# Patient Record
Sex: Female | Born: 1990 | Race: White | Hispanic: No | Marital: Single | Smoking: Never smoker
Health system: Southern US, Community
[De-identification: ages and names within clinical notes are randomized; demographics above are authoritative.]

## PROBLEM LIST (undated history)

## (undated) DIAGNOSIS — E0590 Thyrotoxicosis, unspecified without thyrotoxic crisis or storm: Secondary | ICD-10-CM

## (undated) DIAGNOSIS — E0500 Thyrotoxicosis with diffuse goiter without thyrotoxic crisis or storm: Secondary | ICD-10-CM

## (undated) DIAGNOSIS — R87612 Low grade squamous intraepithelial lesion on cytologic smear of cervix (LGSIL): Secondary | ICD-10-CM

## (undated) DIAGNOSIS — M779 Enthesopathy, unspecified: Secondary | ICD-10-CM

## (undated) DIAGNOSIS — L709 Acne, unspecified: Secondary | ICD-10-CM

## (undated) HISTORY — DX: Thyrotoxicosis, unspecified without thyrotoxic crisis or storm: E05.90

## (undated) HISTORY — DX: Acne, unspecified: L70.9

## (undated) HISTORY — DX: Low grade squamous intraepithelial lesion on cytologic smear of cervix (LGSIL): R87.612

## (undated) HISTORY — DX: Thyrotoxicosis with diffuse goiter without thyrotoxic crisis or storm: E05.00

## (undated) HISTORY — PX: TYMPANOSTOMY TUBE PLACEMENT: SHX32

## (undated) HISTORY — DX: Enthesopathy, unspecified: M77.9

---
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
## 2012-12-31 DIAGNOSIS — R87612 Low grade squamous intraepithelial lesion on cytologic smear of cervix (LGSIL): Secondary | ICD-10-CM

## 2012-12-31 HISTORY — DX: Low grade squamous intraepithelial lesion on cytologic smear of cervix (LGSIL): R87.612

## 2013-01-31 HISTORY — PX: COLPOSCOPY: SHX161

## 2013-03-02 HISTORY — PX: TONSILECTOMY/ADENOIDECTOMY WITH MYRINGOTOMY: SHX6125

## 2013-03-02 HISTORY — PX: THYROIDECTOMY: SHX17

## 2013-03-29 ENCOUNTER — Ambulatory Visit: Payer: Self-pay

## 2013-04-04 HISTORY — PX: OTHER SURGICAL HISTORY: SHX169

## 2013-05-24 ENCOUNTER — Emergency Department: Payer: Self-pay | Admitting: Emergency Medicine

## 2013-05-24 LAB — URINALYSIS, COMPLETE
Bilirubin,UR: NEGATIVE
Glucose,UR: NEGATIVE mg/dL (ref 0–75)
Ketone: NEGATIVE
Nitrite: NEGATIVE
Ph: 7 (ref 4.5–8.0)
Protein: 30
RBC,UR: 156 /HPF (ref 0–5)
Specific Gravity: 1.013 (ref 1.003–1.030)
Squamous Epithelial: 4
WBC UR: 355 /HPF (ref 0–5)

## 2013-12-12 DIAGNOSIS — N879 Dysplasia of cervix uteri, unspecified: Secondary | ICD-10-CM | POA: Insufficient documentation

## 2014-02-01 ENCOUNTER — Ambulatory Visit: Payer: Self-pay | Admitting: Otolaryngology

## 2014-02-01 LAB — ALBUMIN: Albumin: 3.4 g/dL (ref 3.4–5.0)

## 2014-02-01 LAB — CALCIUM
Calcium, Total: 8.5 mg/dL (ref 8.5–10.1)
Calcium, Total: 8.4 mg/dL — ABNORMAL LOW (ref 8.5–10.1)

## 2014-02-02 LAB — CALCIUM: Calcium, Total: 8.7 mg/dL (ref 8.5–10.1)

## 2014-06-18 DIAGNOSIS — E890 Postprocedural hypothyroidism: Secondary | ICD-10-CM | POA: Insufficient documentation

## 2014-06-23 NOTE — Op Note (Signed)
PATIENT NAME:  Samantha Gallagher, Samantha Gallagher MR#:  [id number] DATE OF BIRTH:  08/10/1990  DATE OF PROCEDURE:  02/01/2014  PREOPERATIVE DIAGNOSES:  1. Graves' disease with hyperthyroidism.  2. Thyroid nodule.  3. Chronic tonsillitis.  4. Tonsillar hypertrophy.  5. Sleep disordered breathing.   POSTOPERATIVE DIAGNOSES: 1. Graves' disease with hyperthyroidism.  2. Thyroid nodule.  3. Chronic tonsillitis.  4. Tonsillar hypertrophy.  5. Sleep disordered breathing.    PROCEDURE PERFORMED:  1.  Minimally invasive total thyroidectomy with laryngeal nerve monitoring.  2.  Tonsillectomy, greater than age 9.   SURGEON: Kyung Rudd, M.D.   ASSISTANT: Dr. Marion Downer   ANESTHESIA: General endotracheal anesthesia.   ESTIMATED BLOOD LOSS: 25 mL.   IV FLUIDS: Please see anesthesia record.   COMPLICATIONS: None.   DRAINS AND STENT PLACEMENTS: Surgicel in the thyroid bed.   SPECIMENS:  1. Total thyroidectomy marked at the right superior pole.  2. Right and left tonsils sent for permanent pathological evaluation.   INDICATIONS FOR PROCEDURE: The patient is a 24 year old female with a history of Graves disease on methimazole, followed by Dr. Wendall Mola  for endocrinology. The decision was made to proceed with total thyroidectomy after discussion of treatment options. The patient also began to develop thyroid nodules that were new. The patient also has a longstanding history of tonsillar hypertrophy, sleep-disordered breathing and chronic tonsillitis as well. The decision was made to proceed with a tonsillectomy at the same time.   OPERATIVE FINDINGS: Total thyroidectomy successfully performed. Bilateral nodules. Bilateral superior and inferior parathyroid glands were identified and preserved. Bilateral recurrent laryngeal nerves were identified and preserved and stimulated throughout the duration of the case. 4+ cryptic tonsils with purulence.   DESCRIPTION OF PROCEDURE: After the patient was  identified in holding, the benefits and risks of the procedure were discussed and consent was reviewed. The patient was taken to the Operating Room and placed in the supine position. General endotracheal anesthesia with laryngeal nerve monitoring was placed, and this was confirmed with the glide laryngoscope. At this time, a previously marked anterior neck crease was injected with 5 mL of 0.25% Marcaine 1:100,000 epinephrine as well as prepped and draped in sterile fashion. A 15 blade scalpel was used to make a horizontal neck incision, and dissection through subcutaneous tissues was performed using Bovie electrocautery. Strap muscles were encountered. These were divided vertically along the median raphe from the sternal notch to the thyroid notch. This demonstrated a bulging thyroid, coming through the sternohyoid and sternothyroid muscles beneath that. Attention was directed to the patient's left hemithyroid. The sternohyoid and sternothyroid muscles were separated from the left hemithyroid. There was a moderate amount of scarring to the musculature and to the thyroid capsule. The lateral border was identified. This was dissected bluntly and inferiorly and superiorly, and then a cleft between the larynx and the superior thyroid lobe on the left side was created in Joels space, and then the superior pole was pedicled, and this was ligated using a Harmonic scalpel. At this time, the left hemithyroid was retracted medially with a Terris retractor, and the recurrent laryngeal nerve was identified in the tracheoesophageal groove. This was robustly stimulated. There was a superior parathyroid gland as well as an inferior parathyroid gland which was gently dissected away from the capsule. At this time, the nerve was traced until its insertion into the patient's larynx. The remaining attachments of Berry's ligament were divided with Harmonic scalpel and bipolar, and then the remaining attachments of the left  hemithyroid  were dissected away from the patient's trachea and larynx. Attention at this time was directed to the patient's right thyroid. Again, the sternohyoid and sternothyroid muscles were bluntly dissected, and the lateral edge of the right thyroid was dissected superiorly and inferiorly. The right Joels space between the larynx and the superior thyroid pole was bluntly dissected, and the superior pole was ligated once it was pedicled with a Harmonic scalpel. The right hemithyroid was then retracted medially, and the right hemithyroid was delivered from the wound, given its size. This demonstrated a pedicle-ized recurrent laryngeal nerve as it entered into Berry ligament. This was gently and bluntly dissected and robustly stimulated until the  attachment of Berry ligament were separated. The right inferior parathyroid gland was identified and preserved. The right superior parathyroid gland was identified and preserved. Then, inferior thyroid artery was ligated with the Harmonic scalpel. At this time, once both bilateral recurrent laryngeal nerves as well as superior and inferior parathyroid glands had been identified and protected, the remaining attachments of the thyroid, including a pyramidal lobe, were bluntly and sharply dissected away from the patient's anterior trachea, and this was marked and right superior thyroid lobe for permanent pathological evaluation. At this time, the thyroid beds were copiously irrigated with sterile saline. Meticulous hemostasis was ensured. Surgicel was placed along the thyroid bed, bilaterally. The strap muscles were loosely reapproximated with a single Vicryl stitch, and then the subcutaneous tissues were closed using interrupted Vicryl and then the skin was closed using skin adhesive and topped with a Steri-Strip.    At this time, attention was directed to the patient's tonsillectomy. The patient was rotated to 45 degrees. A shoulder roll was not used, and a doughnut was placed  behind the patient's head, and a McIvor mouth gag was inserted in the patient's oral cavity and suspended from a Mayo stand. A red rubber catheter was placed in the patient's right nasal cavity for retraction of the uvula and soft palate superiorly. At this time, a curved Allis clamp was attached to the superior pole of the patient's right tonsil. This was retracted medially and inferiorly, and the patient's right tonsil was excised in a subcapsular plane. Attention was directed to the patient's left tonsil. In a similar fashion, the patient's left tonsil was retracted medially and inferiorly, and the patient's left tonsil was excised in subcapsular plane using Bovie electrocautery. Meticulous hemostasis was achieved in bilateral tonsillar beds using Bovie suction cautery, and the patient's oral cavity was copiously irrigated. The decision at this time was made to place a single stitch reapproximating the anterior and posterior tonsillar pillars for opening of the airway. This was placed with a single Vicryl bilaterally. At this time, care of the patient was transferred to anesthesia, where the patient tolerated the procedure well and was taken to PACU in good condition.    ____________________________ Kyung Ruddreighton C. Kalaysia Demonbreun, MD [id number] D: 02/01/2014 11:11:07 ET T: 02/01/2014 14:27:11 ET JOB#: [id number]  cc: Kyung Ruddreighton C. Vallorie Niccoli, MD, <Dictator> Kyung RuddREIGHTON C Aigner Horseman MD ELECTRONICALLY SIGNED 02/02/2014 17:51

## 2014-06-25 LAB — SURGICAL PATHOLOGY

## 2014-08-01 HISTORY — PX: LASIK: SHX215

## 2014-10-08 ENCOUNTER — Other Ambulatory Visit: Payer: Self-pay | Admitting: Physician Assistant

## 2014-10-08 ENCOUNTER — Ambulatory Visit
Admission: RE | Admit: 2014-10-08 | Discharge: 2014-10-08 | Disposition: A | Discharge status: Home / Self Care | Payer: BLUE CROSS/BLUE SHIELD | Source: Ambulatory Visit | Attending: Physician Assistant | Admitting: Physician Assistant

## 2014-10-08 DIAGNOSIS — R05 Cough: Secondary | ICD-10-CM | POA: Diagnosis present

## 2014-10-08 DIAGNOSIS — R059 Cough, unspecified: Secondary | ICD-10-CM

## 2014-12-05 IMAGING — NM NM THYROID IMAGING W/ UPTAKE SINGLE (24 HR)
1 series · 3 of 3 positions shown · non-contrast
Comparison: None

RADIOPHARMACEUTICALS:  151 microCuries 2-BOP  sodium Iodide

CLINICAL DATA: Hyperthyroidism.  TSH equal 0.07.

EXAM:
THYROID SCAN AND UPTAKE - 24 HOURS
TECHNIQUE: Following the per oral administration of K-737 sodium iodide, the
patient returned at 24 hours and uptake measurements were acquired
with the uptake probe centered on the neck. Thyroid imaging was
performed following the intravenous administration of the 7c-HHm
Pertechnetate.

[Series 1000: (id) thyroid scan · 2.40mm/px · 3 of 3 slices shown]
[im 1/3  full-range]
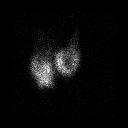
[im 2/3  full-range]
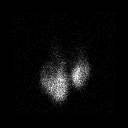
[im 3/3  full-range]
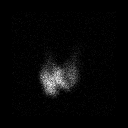

[3 of 3 positions shown; findings below may reference images not displayed]

FINDINGS: There is heterogeneous uptake with in an enlarged thyroid gland.
There are photopenic foci in the upper pole of the right lobe and
mid left lobe of thyroid gland consistent with thyroid nodules.

6 hour I 123 uptake = 26.5 % (normal 5-25%)

24 hour I 123 uptake = 36.6% (normal 10-30%)
IMPRESSION: 1. Findings most consistent with toxic multinodular goiter.
2.  24 hour I 123 uptake = 36.6% (normal 10-30%)

## 2015-07-19 ENCOUNTER — Other Ambulatory Visit: Payer: Self-pay | Admitting: Otolaryngology

## 2015-07-19 DIAGNOSIS — R591 Generalized enlarged lymph nodes: Secondary | ICD-10-CM

## 2015-07-30 ENCOUNTER — Ambulatory Visit: Payer: BLUE CROSS/BLUE SHIELD

## 2015-07-31 ENCOUNTER — Ambulatory Visit
Admission: RE | Admit: 2015-07-31 | Discharge: 2015-07-31 | Disposition: A | Discharge status: Home / Self Care | Payer: BLUE CROSS/BLUE SHIELD | Source: Ambulatory Visit | Attending: Otolaryngology | Admitting: Otolaryngology

## 2015-07-31 DIAGNOSIS — R591 Generalized enlarged lymph nodes: Secondary | ICD-10-CM

## 2015-07-31 DIAGNOSIS — R599 Enlarged lymph nodes, unspecified: Secondary | ICD-10-CM | POA: Diagnosis present

## 2015-07-31 MED ORDER — IOPAMIDOL (ISOVUE-300) INJECTION 61%
75.0000 mL | Freq: Once | INTRAVENOUS | Status: AC | PRN
  Administered 2015-07-31: 75 mL via INTRAVENOUS

## 2015-08-07 ENCOUNTER — Other Ambulatory Visit: Payer: Self-pay | Admitting: Otolaryngology

## 2015-08-07 DIAGNOSIS — R221 Localized swelling, mass and lump, neck: Secondary | ICD-10-CM

## 2015-11-04 ENCOUNTER — Ambulatory Visit: Payer: BLUE CROSS/BLUE SHIELD

## 2015-11-05 ENCOUNTER — Ambulatory Visit: Payer: BLUE CROSS/BLUE SHIELD

## 2015-11-11 ENCOUNTER — Ambulatory Visit
Admission: RE | Admit: 2015-11-11 | Discharge: 2015-11-11 | Disposition: A | Discharge status: Home / Self Care | Payer: BLUE CROSS/BLUE SHIELD | Source: Ambulatory Visit | Attending: Otolaryngology | Admitting: Otolaryngology

## 2015-11-11 DIAGNOSIS — R221 Localized swelling, mass and lump, neck: Secondary | ICD-10-CM | POA: Diagnosis not present

## 2016-01-10 LAB — HM PAP SMEAR: HM Pap smear: NEGATIVE

## 2016-06-17 DIAGNOSIS — E890 Postprocedural hypothyroidism: Secondary | ICD-10-CM | POA: Diagnosis not present

## 2016-06-23 DIAGNOSIS — D485 Neoplasm of uncertain behavior of skin: Secondary | ICD-10-CM | POA: Diagnosis not present

## 2016-06-23 DIAGNOSIS — D229 Melanocytic nevi, unspecified: Secondary | ICD-10-CM | POA: Diagnosis not present

## 2016-06-23 DIAGNOSIS — D239 Other benign neoplasm of skin, unspecified: Secondary | ICD-10-CM | POA: Diagnosis not present

## 2016-06-23 DIAGNOSIS — L700 Acne vulgaris: Secondary | ICD-10-CM | POA: Diagnosis not present

## 2016-06-24 DIAGNOSIS — D485 Neoplasm of uncertain behavior of skin: Secondary | ICD-10-CM | POA: Diagnosis not present

## 2016-06-24 DIAGNOSIS — E890 Postprocedural hypothyroidism: Secondary | ICD-10-CM | POA: Diagnosis not present

## 2016-09-07 DIAGNOSIS — H5213 Myopia, bilateral: Secondary | ICD-10-CM | POA: Diagnosis not present

## 2016-10-20 DIAGNOSIS — S46311A Strain of muscle, fascia and tendon of triceps, right arm, initial encounter: Secondary | ICD-10-CM | POA: Diagnosis not present

## 2016-10-20 DIAGNOSIS — M222X1 Patellofemoral disorders, right knee: Secondary | ICD-10-CM | POA: Diagnosis not present

## 2016-11-01 ENCOUNTER — Other Ambulatory Visit: Payer: Self-pay | Admitting: Obstetrics and Gynecology

## 2016-11-27 DIAGNOSIS — J019 Acute sinusitis, unspecified: Secondary | ICD-10-CM | POA: Diagnosis not present

## 2016-12-17 DIAGNOSIS — E890 Postprocedural hypothyroidism: Secondary | ICD-10-CM | POA: Diagnosis not present

## 2016-12-23 DIAGNOSIS — E890 Postprocedural hypothyroidism: Secondary | ICD-10-CM | POA: Diagnosis not present

## 2017-01-11 ENCOUNTER — Encounter: Payer: Self-pay | Admitting: Obstetrics and Gynecology

## 2017-01-11 ENCOUNTER — Ambulatory Visit (INDEPENDENT_AMBULATORY_CARE_PROVIDER_SITE_OTHER): Payer: BLUE CROSS/BLUE SHIELD | Admitting: Obstetrics and Gynecology

## 2017-01-11 DIAGNOSIS — Z01419 Encounter for gynecological examination (general) (routine) without abnormal findings: Secondary | ICD-10-CM | POA: Diagnosis not present

## 2017-01-11 DIAGNOSIS — Z1231 Encounter for screening mammogram for malignant neoplasm of breast: Secondary | ICD-10-CM | POA: Diagnosis not present

## 2017-01-11 DIAGNOSIS — Z1239 Encounter for other screening for malignant neoplasm of breast: Secondary | ICD-10-CM

## 2017-01-11 MED ORDER — LEVONORGESTREL-ETHINYL ESTRAD 0.1-20 MG-MCG PO TABS: 1.0000 | ORAL_TABLET | Freq: Every day | ORAL | 3 refills | Status: DC

## 2017-01-11 NOTE — Patient Instructions (Signed)
Preventive Care 18-39 Years, Female Preventive care refers to lifestyle choices and visits with your health care provider that can promote health and wellness. What does preventive care include?  A yearly physical exam. This is also called an annual well check.  Dental exams once or twice a year.  Routine eye exams. Ask your health care provider how often you should have your eyes checked.  Personal lifestyle choices, including: ? Daily care of your teeth and gums. ? Regular physical activity. ? Eating a healthy diet. ? Avoiding tobacco and drug use. ? Limiting alcohol use. ? Practicing safe sex. ? Taking vitamin and mineral supplements as recommended by your health care provider. What happens during an annual well check? The services and screenings done by your health care provider during your annual well check will depend on your age, overall health, lifestyle risk factors, and family history of disease. Counseling Your health care provider may ask you questions about your:  Alcohol use.  Tobacco use.  Drug use.  Emotional well-being.  Home and relationship well-being.  Sexual activity.  Eating habits.  Work and work Statistician.  Method of birth control.  Menstrual cycle.  Pregnancy history.  Screening You may have the following tests or measurements:  Height, weight, and BMI.  Diabetes screening. This is done by checking your blood sugar (glucose) after you have not eaten for a while (fasting).  Blood pressure.  Lipid and cholesterol levels. These may be checked every 5 years starting at age 66.  Skin check.  Hepatitis C blood test.  Hepatitis B blood test.  Sexually transmitted disease (STD) testing.  BRCA-related cancer screening. This may be done if you have a family history of breast, ovarian, tubal, or peritoneal cancers.  Pelvic exam and Pap test. This may be done every 3 years starting at age 40. Starting at age 59, this may be done every 5  years if you have a Pap test in combination with an HPV test.  Discuss your test results, treatment options, and if necessary, the need for more tests with your health care provider. Vaccines Your health care provider may recommend certain vaccines, such as:  Influenza vaccine. This is recommended every year.  Tetanus, diphtheria, and acellular pertussis (Tdap, Td) vaccine. You may need a Td booster every 10 years.  Varicella vaccine. You may need this if you have not been vaccinated.  HPV vaccine. If you are 69 or younger, you may need three doses over 6 months.  Measles, mumps, and rubella (MMR) vaccine. You may need at least one dose of MMR. You may also need a second dose.  Pneumococcal 13-valent conjugate (PCV13) vaccine. You may need this if you have certain conditions and were not previously vaccinated.  Pneumococcal polysaccharide (PPSV23) vaccine. You may need one or two doses if you smoke cigarettes or if you have certain conditions.  Meningococcal vaccine. One dose is recommended if you are age 27-21 years and a first-year college student living in a residence hall, or if you have one of several medical conditions. You may also need additional booster doses.  Hepatitis A vaccine. You may need this if you have certain conditions or if you travel or work in places where you may be exposed to hepatitis A.  Hepatitis B vaccine. You may need this if you have certain conditions or if you travel or work in places where you may be exposed to hepatitis B.  Haemophilus influenzae type b (Hib) vaccine. You may need this if  you have certain risk factors.  Talk to your health care provider about which screenings and vaccines you need and how often you need them. This information is not intended to replace advice given to you by your health care provider. Make sure you discuss any questions you have with your health care provider. Document Released: 04/14/2001 Document Revised: 11/06/2015  Document Reviewed: 12/18/2014 Elsevier Interactive Patient Education  2017 [employer].

## 2017-01-11 NOTE — Progress Notes (Signed)
Gynecology Annual Exam  PCP: Margaretann LovelessBurnette, Jennifer M, PA-C  Chief Complaint:  Chief Complaint  Patient presents with  . Gynecologic Exam    History of Present Illness: Patient is a 26 y.o. G0P0000 presents for annual exam. The patient has no complaints today.   LMP: Patient's last menstrual period was 12/11/2016. Average Interval: regular, 28 days Duration of flow: 5 days Heavy Menses: no Clots: no Intermenstrual Bleeding: no Postcoital Bleeding: no Dysmenorrhea: no  The patient is sexually active. She currently uses OCP (estrogen/progesterone) for contraception. She denies dyspareunia.  The patient does perform self breast exams.  There is no notable family history of breast or ovarian cancer in her family.  The patient wears seatbelts: yes.   The patient has regular exercise: not asked.    The patient denies current symptoms of depression.    Review of Systems: Review of Systems  Constitutional: Negative for chills and fever.  HENT: Negative for congestion.   Respiratory: Negative for cough and shortness of breath.   Cardiovascular: Negative for chest pain and palpitations.  Gastrointestinal: Negative for abdominal pain, constipation, diarrhea, heartburn, nausea and vomiting.  Genitourinary: Negative for dysuria, frequency and urgency.  Skin: Negative for itching and rash.  Neurological: Negative for dizziness and headaches.  Endo/Heme/Allergies: Negative for polydipsia.  Psychiatric/Behavioral: Negative for depression.    Past Medical History:  Past Medical History:  Diagnosis Date  . Graves disease 2006  . Hyperthyroidism   . 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 [facility]  . Fine needle aspiration of Thyroid  04/04/2013  . LASIK  08/2014  . TYMPANOSTOMY TUBE PLACEMENT      Gynecologic History:  Patient's last menstrual period was  12/11/2016. Contraception: OCP (estrogen/progesterone) Last Pap: Results were:01/10/2016 no abnormalities   Obstetric History: G0P0000  Family History:  Family History  Problem Relation Age of Onset  . Pancreatic cancer Mother   . Hypothyroidism Maternal Aunt     Social History:  Social History   Socioeconomic History  . Marital status: Single    Spouse name: Not on file  . Number of children: Not on file  . Years of education: Not on file  . Highest education level: Not on file  Social Needs  . Financial resource strain: Not on file  . Food insecurity - worry: Not on file  . Food insecurity - inability: Not on file  . Transportation needs - medical: Not on file  . Transportation needs - non-medical: Not on file  Occupational History  . Not on file  Tobacco Use  . Smoking status: Never Smoker  . Smokeless tobacco: Never Used  Substance and Sexual Activity  . Alcohol use: Yes  . Drug use: No  . Sexual activity: Yes    Partners: Male    Birth control/protection: Pill  Other Topics Concern  . Not on file  Social History Narrative  . Not on file    Allergies:  Allergies  Allergen Reactions  . Penicillins Nausea And Vomiting    Pt. States she is allergic to all "cillins"    Medications: Prior to Admission medications   Medication Sig Start Date End Date Taking? Authorizing Provider  levonorgestrel-ethinyl estradiol (ORSYTHIA) 0.1-20 MG-MCG tablet Take by mouth. 12/19/15  Yes [provider]  levothyroxine (SYNTHROID, LEVOTHROID) 137 MCG tablet Take by mouth. 12/23/16  Yes [provider]  Physical Exam Vitals: Blood pressure 108/72, pulse 87, height 5\' 6"  (1.676 m), weight 141 lb (64 kg), last menstrual period 12/11/2016.  General: NAD HEENT: normocephalic, anicteric Thyroid: no enlargement, no palpable nodules Pulmonary: No increased work of breathing, CTAB Cardiovascular: RRR, distal pulses 2+ Breast: Breast symmetrical, no  tenderness, no palpable nodules or masses, no skin or nipple retraction present, no nipple discharge.  No axillary or supraclavicular lymphadenopathy. Abdomen: NABS, soft, non-tender, non-distended.  Umbilicus without lesions.  No hepatomegaly, splenomegaly or masses palpable. No evidence of hernia  Genitourinary:  External: Normal external female genitalia.  Normal urethral meatus, normal  Bartholin's and Skene's glands.    Vagina: Normal vaginal mucosa, no evidence of prolapse.    Cervix: Grossly normal in appearance, no bleeding  Uterus: Non-enlarged, mobile, normal contour.  No CMT  Adnexa: ovaries non-enlarged, no adnexal masses  Rectal: deferred  Lymphatic: no evidence of inguinal lymphadenopathy Extremities: no edema, erythema, or tenderness Neurologic: Grossly intact Psychiatric: mood appropriate, affect full  Female chaperone present for pelvic and breast  portions of the physical exam    Assessment: 26 y.o. G0P0000 routine annual exam  Plan: Problem List Items Addressed This Visit    None    Visit Diagnoses    Breast screening       Encounter for gynecological examination without abnormal finding          1) STI screening was offered and declined  2) ASCCP guidelines and rational discussed.  Patient opts for every 3 years screening interval  3) Contraception - continue OCP  4) Routine healthcare maintenance including cholesterol, diabetes screening discussed managed by PCP [place]  5) Follow up 1 year for routine annual exam

## 2017-04-30 DIAGNOSIS — T7840XA Allergy, unspecified, initial encounter: Secondary | ICD-10-CM | POA: Diagnosis not present

## 2017-06-16 DIAGNOSIS — E890 Postprocedural hypothyroidism: Secondary | ICD-10-CM | POA: Diagnosis not present

## 2017-06-23 DIAGNOSIS — E890 Postprocedural hypothyroidism: Secondary | ICD-10-CM | POA: Diagnosis not present

## 2017-08-05 DIAGNOSIS — E890 Postprocedural hypothyroidism: Secondary | ICD-10-CM | POA: Diagnosis not present

## 2017-09-02 IMAGING — US US SOFT TISSUE HEAD/NECK
1 series · 10 of 10 positions shown · non-contrast
Comparison: CT scan of the neck 07/31/2015

CLINICAL DATA: 25-year-old female with a palpable right neck mass

EXAM:
ULTRASOUND OF HEAD/NECK SOFT TISSUES
TECHNIQUE: Ultrasound examination of the head and neck soft tissues was
performed in the area of clinical concern.

[Series 1: us soft tissue head/neck · 0.07mm/px · 10 acquisitions, 10 frames shown]
[im 1/10]
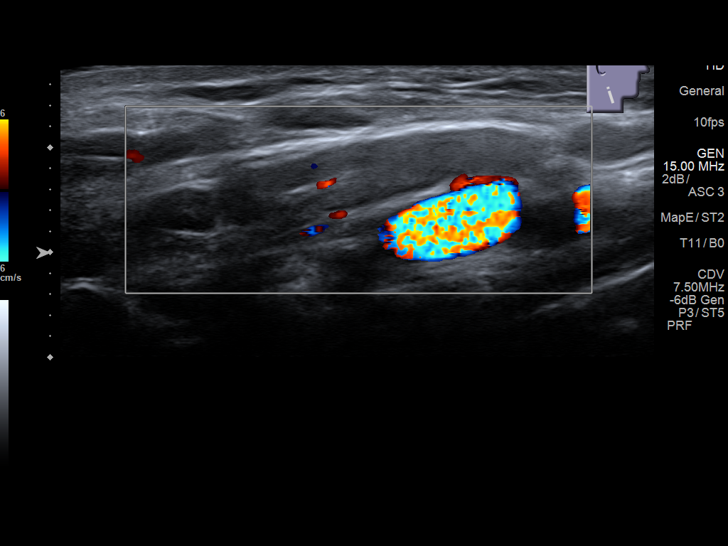
[im 2/10]
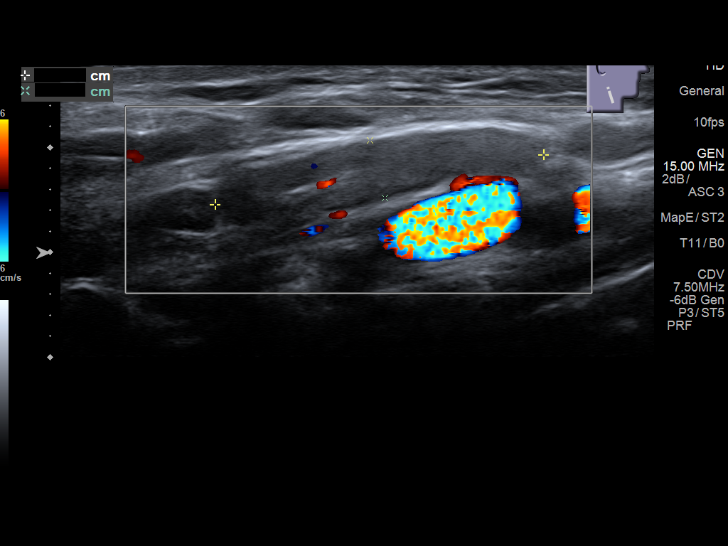
[im 3/10]
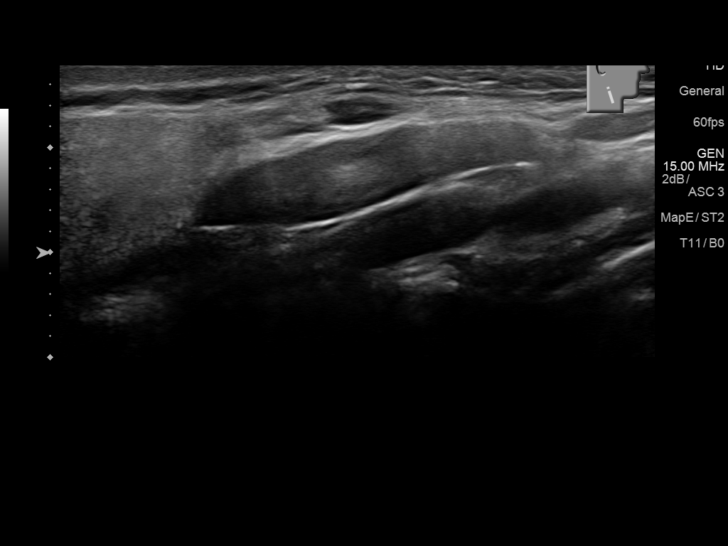
[im 4/10]
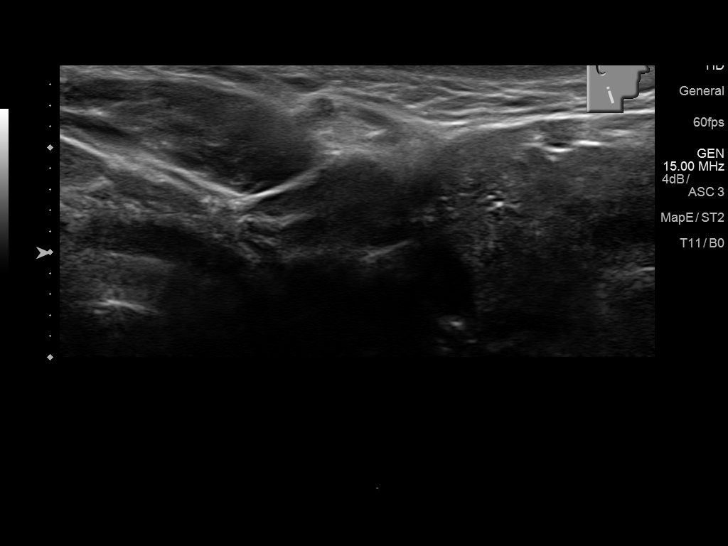
[im 5/10]
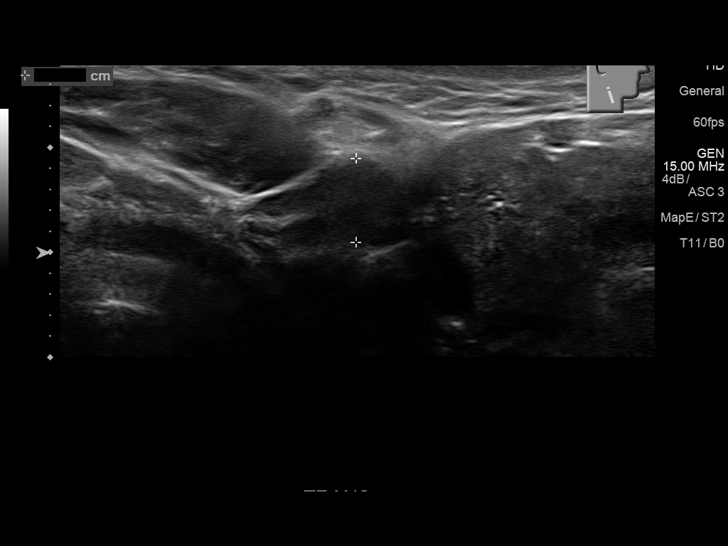
[im 6/10]
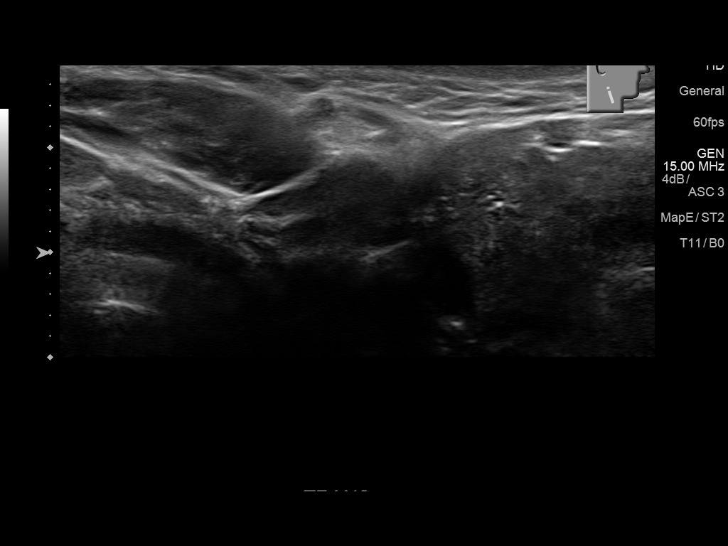
[im 7/10]
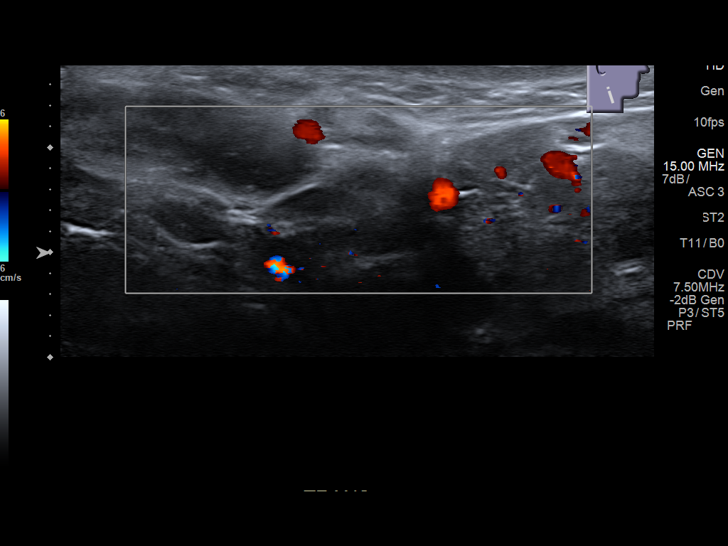
[im 8/10]
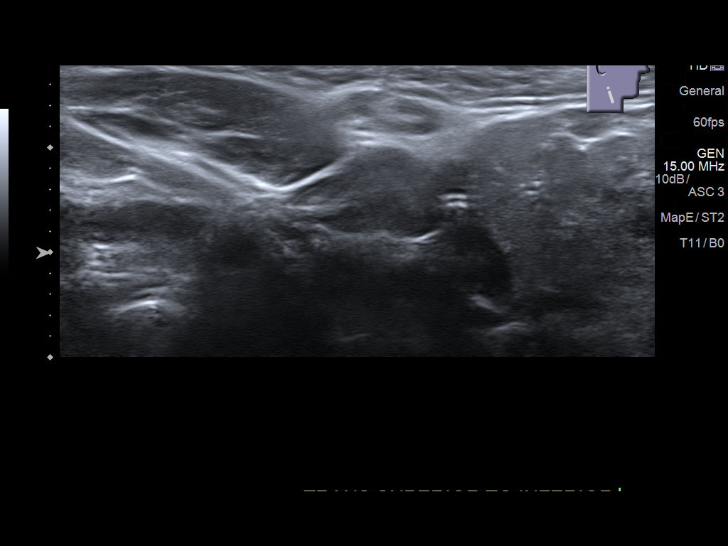
[im 9/10]
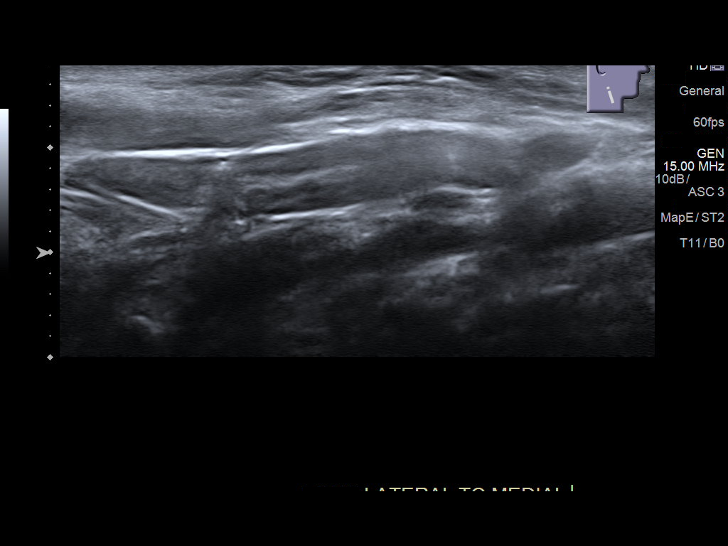
[im 10/10]
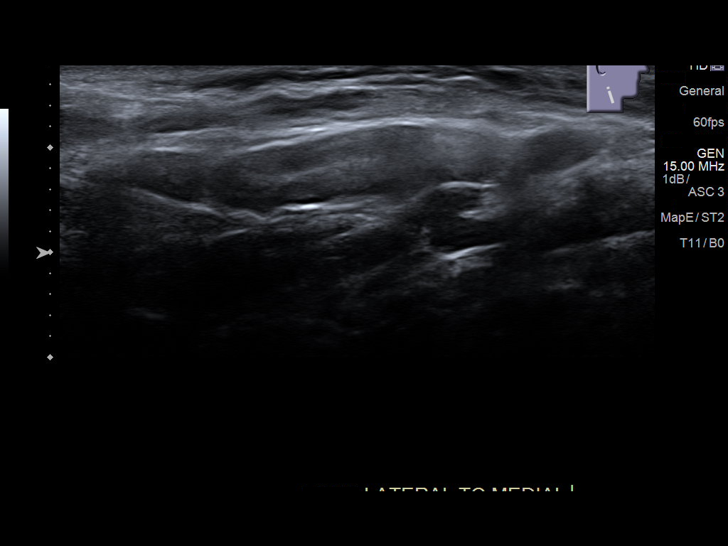

[10 of 10 positions shown; findings below may reference images not displayed]

FINDINGS: Sonographic interrogation of the right lateral neck demonstrates an
elongated hypoechoic nodule with a central echogenic hilum and
vascular pedicle most consistent with a lymph node. While the lymph
node is elongated it remains within normal limits in short axis at
0.6 cm. No significant irregularity of the cortex in no evidence of
necrosis.
IMPRESSION: The palpable abnormality corresponds with a mildly elongated but
otherwise morphologically unremarkable right jugular chain lymph
node.

Comparing across modalities to the prior CT scan of the neck, no
significant interval change.

## 2017-09-13 DIAGNOSIS — H5213 Myopia, bilateral: Secondary | ICD-10-CM | POA: Diagnosis not present

## 2017-12-21 DIAGNOSIS — E890 Postprocedural hypothyroidism: Secondary | ICD-10-CM | POA: Diagnosis not present

## 2017-12-28 DIAGNOSIS — E890 Postprocedural hypothyroidism: Secondary | ICD-10-CM | POA: Diagnosis not present

## 2018-01-13 ENCOUNTER — Encounter: Payer: Self-pay | Admitting: Obstetrics and Gynecology

## 2018-01-13 ENCOUNTER — Ambulatory Visit (INDEPENDENT_AMBULATORY_CARE_PROVIDER_SITE_OTHER): Payer: BLUE CROSS/BLUE SHIELD | Admitting: Obstetrics and Gynecology

## 2018-01-13 VITALS — BP 118/86 | HR 74 | Ht 67.0 in | Wt 151.0 lb

## 2018-01-13 DIAGNOSIS — Z01419 Encounter for gynecological examination (general) (routine) without abnormal findings: Secondary | ICD-10-CM | POA: Diagnosis not present

## 2018-01-13 DIAGNOSIS — Z1239 Encounter for other screening for malignant neoplasm of breast: Secondary | ICD-10-CM

## 2018-01-13 MED ORDER — LEVONORGESTREL-ETHINYL ESTRAD 0.1-20 MG-MCG PO TABS: 1.0000 | ORAL_TABLET | Freq: Every day | ORAL | 3 refills | Status: DC

## 2018-01-13 NOTE — Progress Notes (Signed)
Gynecology Annual Exam   PCP: Margaretann Loveless, PA-C  Chief Complaint:  Chief Complaint  Patient presents with  . Gynecologic Exam    History of Present Illness: Patient is a 27 y.o. G0P0000 presents for annual exam. The patient has no complaints today.   LMP: Patient's last menstrual period was 01/06/2018 (exact date). Average Interval: regular, 28 days Duration of flow: variable, has gotten significantly lighter in past 6 months, she has also been working out more rigerously Heavy Menses: no Clots: no Intermenstrual Bleeding: no Postcoital Bleeding: no Dysmenorrhea: no  The patient is not currently sexually active. She currently uses OCP (estrogen/progesterone) for contraception.There is no notable family history of breast or ovarian cancer in her family.  The patient wears seatbelts: yes.   The patient has regular exercise: not asked.    The patient denies current symptoms of depression.    Review of Systems: Review of Systems  Constitutional: Negative for chills and fever.  HENT: Negative for congestion.   Respiratory: Negative for cough and shortness of breath.   Cardiovascular: Negative for chest pain and palpitations.  Gastrointestinal: Negative for abdominal pain, constipation, diarrhea, heartburn, nausea and vomiting.  Genitourinary: Negative for dysuria, frequency and urgency.  Skin: Negative for itching and rash.  Neurological: Negative for dizziness and headaches.  Endo/Heme/Allergies: Negative for polydipsia.  Psychiatric/Behavioral: Negative for depression.    Past Medical History:  Past Medical History:  Diagnosis Date  . Graves disease 2006  . Hyperthyroidism   . 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 COLPOSCOPY  01/31/2013   [facility]  . Fine needle aspiration of Thyroid  04/04/2013  . LASIK  08/2014  . TYMPANOSTOMY TUBE PLACEMENT      Gynecologic  History:  Patient's last menstrual period was 01/06/2018 (exact date). Contraception: OCP (estrogen/progesterone) Last Pap: Results were:01/10/2016 no abnormalities   Obstetric History: G0P0000  Family History:  Family History  Problem Relation Age of Onset  . Pancreatic cancer Mother 63  . Hypothyroidism Maternal Aunt     Social History:  Social History   Socioeconomic History  . Marital status: Single    Spouse name: Not on file  . Number of children: Not on file  . Years of education: Not on file  . Highest education level: Not on file  Occupational History  . Not on file  Social Needs  . Financial resource strain: Not on file  . Food insecurity:    Worry: Not on file    Inability: Not on file  . Transportation needs:    Medical: Not on file    Non-medical: Not on file  Tobacco Use  . Smoking status: Never Smoker  . Smokeless tobacco: Never Used  Substance and Sexual Activity  . Alcohol use: Yes  . Drug use: No  . Sexual activity: Yes    Partners: Male    Birth control/protection: Pill  Lifestyle  . Physical activity:    Days per week: 6 days    Minutes per session: 50 min  . Stress: Not at all  Relationships  . Social connections:    Talks on phone: Twice a week    Gets together: Once a week    Attends religious service: More than 4 times per year    Active member of club or organization: No    Attends meetings of clubs or organizations: Never  Relationship status: Married  . Intimate partner violence:    Fear of current or ex partner: No    Emotionally abused: No    Physically abused: No    Forced sexual activity: No  Other Topics Concern  . Not on file  Social History Narrative  . Not on file    Allergies:  Allergies  Allergen Reactions  . Penicillins Nausea And Vomiting    Pt. States she is allergic to all "cillins"    Medications: Prior to Admission medications   Medication Sig Start Date End Date Taking? Authorizing Provider    levonorgestrel-ethinyl estradiol (ORSYTHIA) 0.1-20 MG-MCG tablet Take 1 tablet daily by mouth. 01/11/17  Yes Vena AustriaStaebler, Aadan Chenier, MD  levothyroxine (SYNTHROID, LEVOTHROID) 137 MCG tablet Take by mouth. 12/23/16  Yes [provider]  minocycline (DYNACIN) 100 MG tablet TK 1 T PO D 12/31/17  Yes [provider]  tretinoin (RETIN-A) 0.1 % cream APPLY A PEA SIZED AMOUNT TOPICALLY TO ENTIRE FACE AT BEDTIME 01/03/18  Yes [provider]    Physical Exam Vitals: Blood pressure 118/86, pulse 74, height 5\' 7"  (1.702 m), weight 151 lb (68.5 kg), last menstrual period 01/06/2018.  General: NAD HEENT: normocephalic, anicteric Thyroid: no enlargement, no palpable nodules Pulmonary: No increased work of breathing, CTAB Cardiovascular: RRR, distal pulses 2+ Breast: Breast symmetrical, no tenderness, no palpable nodules or masses, no skin or nipple retraction present, no nipple discharge.  No axillary or supraclavicular lymphadenopathy. Abdomen: NABS, soft, non-tender, non-distended.  Umbilicus without lesions.  No hepatomegaly, splenomegaly or masses palpable. No evidence of hernia  Genitourinary:  External: Normal external female genitalia.  Normal urethral meatus, normal Bartholin's and Skene's glands.    Vagina: Normal vaginal mucosa, no evidence of prolapse.    Cervix: Grossly normal in appearance, no bleeding  Uterus: Non-enlarged, mobile, normal contour.  No CMT  Adnexa: ovaries non-enlarged, no adnexal masses  Rectal: deferred  Lymphatic: no evidence of inguinal lymphadenopathy Extremities: no edema, erythema, or tenderness Neurologic: Grossly intact Psychiatric: mood appropriate, affect full  Female chaperone present for pelvic and breast  portions of the physical exam    Assessment: 27 y.o. G0P0000 routine annual exam  Plan: Problem List Items Addressed This Visit    None    Visit Diagnoses    Encounter for gynecological examination without abnormal finding     -  Primary   Breast screening          1) STI screening  wasoffered and declined  2)  ASCCP guidelines and rational discussed.  Patient opts for every 3 years screening interval  3) Contraception - the patient is currently using  OCP (estrogen/progesterone).  She is happy with her current form of contraception and plans to continue  - if lighter withdrawal bleed become concerning we discussed changing up OCP  4) Routine healthcare maintenance including cholesterol, diabetes screening discussed managed by PCP - Dr. Alesia MorinSolumn following thyroid  5) Return in about 1 year (around 01/14/2019) for annual.   Vena AustriaAndreas Ashanty Coltrane, MD, Merlinda FrederickFACOG [facility], [facility] 01/13/2018, 10:48 AM

## 2018-03-27 ENCOUNTER — Other Ambulatory Visit: Payer: Self-pay | Admitting: Obstetrics and Gynecology

## 2018-06-02 DIAGNOSIS — M25512 Pain in left shoulder: Secondary | ICD-10-CM | POA: Diagnosis not present

## 2018-06-02 DIAGNOSIS — M25511 Pain in right shoulder: Secondary | ICD-10-CM | POA: Diagnosis not present

## 2018-06-15 DIAGNOSIS — M25512 Pain in left shoulder: Secondary | ICD-10-CM | POA: Diagnosis not present

## 2018-10-17 ENCOUNTER — Encounter: Payer: Self-pay | Admitting: Internal Medicine

## 2018-10-17 ENCOUNTER — Ambulatory Visit: Payer: 59 | Admitting: Internal Medicine

## 2018-10-17 ENCOUNTER — Other Ambulatory Visit: Payer: Self-pay

## 2018-10-17 VITALS — BP 112/70 | HR 74 | Temp 99.2°F | Resp 16 | Ht 67.0 in | Wt 148.0 lb

## 2018-10-17 DIAGNOSIS — E890 Postprocedural hypothyroidism: Secondary | ICD-10-CM

## 2018-10-17 DIAGNOSIS — R5382 Chronic fatigue, unspecified: Secondary | ICD-10-CM

## 2018-10-17 DIAGNOSIS — G4700 Insomnia, unspecified: Secondary | ICD-10-CM | POA: Insufficient documentation

## 2018-10-17 DIAGNOSIS — L709 Acne, unspecified: Secondary | ICD-10-CM | POA: Insufficient documentation

## 2018-10-17 NOTE — Progress Notes (Signed)
S -   28 y.o. female patient who presents with fatigue and not sleeping for past 1.5 years, but really more problematic almost nightly for the past 6 months Notes is able to get to sleep, struggles awakening after one to two hours and then getting back to sleep. Cant get comfortable and then gets more and more anxious at times trying. Denies being an anxious person at baseline and no h/o panic attacks, denies major stressors recent past. 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 which is less stressful for her No marked CP's, palp's, SOB LE swelling, no weight changes, no swellings, and her last thyroid check was in October by her endocrinologist at [facility] and now gets them checked yearly (last complete labs here in August of 2019 and ok) No recent infectious sx's with no fevers, cough, or other Covid concerning sx's No anemia history and no increased bleeding recent past Sees gynecology yearly for PAP and exams and on an OC presently  No tobacco history Alcohol use - notes when does or doesn't have alcohol, not change problems with sleep, social  Caffeine use - not take after noon presently  Not watch TV at night before bed, tried melatonin 5 and 10 mg doses, tried OTC gummy bear sleep aid, tried meditation and not helping She has been concerned with trying meds llike Ambien (had reports of a friend who noted [employer] stuff showing up and ordered overnight and not recall doing so)  Current Outpatient Medications on File Prior to Visit  Medication Sig Dispense Refill  . levothyroxine (SYNTHROID, LEVOTHROID) 137 MCG tablet Take by mouth.    . tretinoin (RETIN-A) 0.1 % cream APPLY A PEA SIZED AMOUNT TOPICALLY TO ENTIRE FACE AT BEDTIME  4  . VIENVA 0.1-20 MG-MCG tablet TAKE 1 TABLET BY MOUTH DAILY 84 tablet 3   No current facility-administered medications on file prior to visit.      Allergies  Allergen Reactions  . Ceclor [Cefaclor] Nausea And Vomiting  . Penicillins  Nausea And Vomiting    Pt. States she is allergic to all "cillins"      O - NAD, masked  BP 112/70 (BP Location: Right Arm, Patient Position: Sitting, Cuff Size: Normal)   Pulse 74   Temp 99.2 F (37.3 C) (Oral)   Resp 16   Ht 5\' 7"  (1.702 m)   Wt 148 lb (67.1 kg)   SpO2 100%   BMI 23.18 kg/m    HEENT - sclera anicteric, PERRL, EOMI, no proptosis Neck - no adenopathy nor TM Car - RRR without m/g/r Pulm - CTA Abd - soft, NT  Ext - no LE edema Neuro - affect not flat, approp with conversation, speech not rapid Grossly non-focal  Ass - 1. Fatigue - likely due to the insomnia   2. Insomnia - more chronic and sleep maintenance (staying asleep)  3. H/o Graves and s/p ablation and on thyroid supp - followed by endo still thru [facility] - Educated and long discussion on management of chronic insomnia, CBT still first line therapy and discussed possible counseling referral to [facility] and if she felt would be a reasonable option. Will investigate [facility] and if may be helpful with this as well  Will recheck labs, and will get the annual profile that is due for her annual exam and she can come this week and get them fasting and then f/u after, likely the week after next as I will be away next  week and she was ok with this, will add a ferritin to the labs as well. Will further discuss potential meds (briefly discussed some today) on f/u after making sure thyroid tests ok, not anemic, blood sugar ok, and other labs ok as well   F/u sooner prn

## 2018-10-17 NOTE — Progress Notes (Signed)
6 months of not getting a full nights restful sleep.  States wakes up during the night.  Has tried Melatonin, meditation, no caffeine after noon without relief.  Affects her during the day - tired, grouchy & physically exhausted that it makes her feel sick.  AMD

## 2018-10-25 ENCOUNTER — Other Ambulatory Visit: Payer: Self-pay

## 2018-10-25 ENCOUNTER — Ambulatory Visit: Payer: 59

## 2018-10-25 DIAGNOSIS — Z01818 Encounter for other preprocedural examination: Secondary | ICD-10-CM

## 2018-10-25 LAB — POCT URINALYSIS DIPSTICK
Bilirubin, UA: NEGATIVE
Blood, UA: NEGATIVE
Glucose, UA: NEGATIVE
Ketones, UA: NEGATIVE
Leukocytes, UA: NEGATIVE
Nitrite, UA: NEGATIVE
Protein, UA: NEGATIVE
Spec Grav, UA: 1.02 (ref 1.010–1.025)
Urobilinogen, UA: 0.2 E.U./dL
pH, UA: 6 (ref 5.0–8.0)

## 2018-10-25 NOTE — Addendum Note (Signed)
Addended by: Aliene Altes on: 10/25/2018 08:30 AM   Modules accepted: Orders

## 2018-10-27 LAB — CMP12+LP+TP+TSH+6AC+CBC/D/PLT
ALT: 11 IU/L (ref 0–32)
AST: 14 IU/L (ref 0–40)
Albumin/Globulin Ratio: 2.1 (ref 1.2–2.2)
Albumin: 4.7 g/dL (ref 3.9–5.0)
Alkaline Phosphatase: 63 IU/L (ref 39–117)
BUN/Creatinine Ratio: 11 (ref 9–23)
BUN: 11 mg/dL (ref 6–20)
Basophils Absolute: 0.1 10*3/uL (ref 0.0–0.2)
Basos: 1 %
Bilirubin Total: 0.4 mg/dL (ref 0.0–1.2)
Calcium: 9.4 mg/dL (ref 8.7–10.2)
Chloride: 101 mmol/L (ref 96–106)
Chol/HDL Ratio: 1.8 ratio (ref 0.0–4.4)
Cholesterol, Total: 154 mg/dL (ref 100–199)
Creatinine, Ser: 0.96 mg/dL (ref 0.57–1.00)
EOS (ABSOLUTE): 0.1 10*3/uL (ref 0.0–0.4)
Eos: 2 %
Estimated CHD Risk: <0.5 times avg. (ref 0.0–1.0)
Free Thyroxine Index: 2.5 (ref 1.2–4.9)
GFR calc Af Amer: 92 mL/min/{1.73_m2} (ref >59)
GFR calc non Af Amer: 80 mL/min/{1.73_m2} (ref >59)
GGT: 21 IU/L (ref 0–60)
Globulin, Total: 2.2 g/dL (ref 1.5–4.5)
Glucose: 80 mg/dL (ref 65–99)
HDL: 87 mg/dL (ref >39)
Hematocrit: 40.8 % (ref 34.0–46.6)
Hemoglobin: 13.4 g/dL (ref 11.1–15.9)
Immature Grans (Abs): 0 10*3/uL (ref 0.0–0.1)
Immature Granulocytes: 0 %
Iron: 112 ug/dL (ref 27–159)
LDH: 153 IU/L (ref 119–226)
LDL Calculated: 55 mg/dL (ref 0–99)
Lymphocytes Absolute: 2.6 10*3/uL (ref 0.7–3.1)
Lymphs: 34 %
MCH: 30.1 pg (ref 26.6–33.0)
MCHC: 32.8 g/dL (ref 31.5–35.7)
MCV: 92 fL (ref 79–97)
Monocytes Absolute: 0.6 10*3/uL (ref 0.1–0.9)
Monocytes: 7 %
Neutrophils Absolute: 4.3 10*3/uL (ref 1.4–7.0)
Neutrophils: 56 %
Phosphorus: 3.4 mg/dL (ref 3.0–4.3)
Platelets: 315 10*3/uL (ref 150–450)
Potassium: 4.6 mmol/L (ref 3.5–5.2)
RBC: 4.45 x10E6/uL (ref 3.77–5.28)
RDW: 11.9 % (ref 11.7–15.4)
Sodium: 139 mmol/L (ref 134–144)
T3 Uptake Ratio: 28 % (ref 24–39)
T4, Total: 8.8 ug/dL (ref 4.5–12.0)
TSH: 5.32 u[IU]/mL — ABNORMAL HIGH (ref 0.450–4.500)
Total Protein: 6.9 g/dL (ref 6.0–8.5)
Triglycerides: 58 mg/dL (ref 0–149)
Uric Acid: 4.9 mg/dL (ref 2.5–7.1)
VLDL Cholesterol Cal: 12 mg/dL (ref 5–40)
WBC: 7.7 10*3/uL (ref 3.4–10.8)

## 2018-10-27 LAB — FERRITIN: Ferritin: 66 ng/mL (ref 15–150)

## 2018-11-01 ENCOUNTER — Encounter: Payer: Self-pay | Admitting: Internal Medicine

## 2018-11-01 ENCOUNTER — Ambulatory Visit: Payer: Self-pay | Admitting: Internal Medicine

## 2018-11-01 ENCOUNTER — Other Ambulatory Visit: Payer: Self-pay

## 2018-11-01 VITALS — BP 104/68 | HR 92 | Temp 98.0°F | Resp 14 | Ht 67.0 in | Wt 145.0 lb

## 2018-11-01 DIAGNOSIS — G4700 Insomnia, unspecified: Secondary | ICD-10-CM

## 2018-11-01 DIAGNOSIS — Z0000 Encounter for general adult medical examination without abnormal findings: Secondary | ICD-10-CM

## 2018-11-01 DIAGNOSIS — E890 Postprocedural hypothyroidism: Secondary | ICD-10-CM

## 2018-11-01 DIAGNOSIS — R5382 Chronic fatigue, unspecified: Secondary | ICD-10-CM

## 2018-11-01 MED ORDER — ESZOPICLONE 1 MG PO TABS: 1.0000 mg | ORAL_TABLET | Freq: Every evening | ORAL | 3 refills | Status: AC | PRN

## 2018-11-01 NOTE — Progress Notes (Signed)
S -   28 y.o. female patient who presents for f/u annual exam after I saw 8/17 with fatigue and not sleeping for past 1.5 years, but was really more problematic almost nightly the past 6 months Noted was able to get to sleep, struggled awakening after one to two hours and then getting back to sleep. Could not get comfortable and then got more and more anxious at times trying. Denied being an anxious person at baseline and no h/o panic attacks, denied major stressors recent past.  No real changes since our last visit. Wakes up at 0200 every night still. 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  Denies recent CP's, palp's, SOB, LE swelling, no major weight changes, no swellings, and her last thyroid check [facility] and was ok and now gets them checked yearly.  No recent infectious sx's with no fevers, cough, or other Covid concerning sx's No anemia history and no increased bleeding recent past Sees gynecology yearly for PAP and exams and on an OC presently, usually in Nov. Had a culposcopy in 2014 with abnl PAP.   Allergies  Allergen Reactions  . Ceclor [Cefaclor] Nausea And Vomiting  . Erythromycin Other (See Comments)  . Penicillins Nausea And Vomiting    Pt. States she is allergic to all "cillins"   Current Outpatient Medications on File Prior to Visit  Medication Sig Dispense Refill  . levothyroxine (SYNTHROID, LEVOTHROID) 137 MCG tablet Take by mouth.    . tretinoin (RETIN-A) 0.1 % cream APPLY A PEA SIZED AMOUNT TOPICALLY TO ENTIRE FACE AT BEDTIME  4  . VIENVA 0.1-20 MG-MCG tablet TAKE 1 TABLET BY MOUTH DAILY 84 tablet 3   No current facility-administered medications on file prior to visit.     No tobacco history Alcohol use - notes when does or doesn't have alcohol, not change problems with sleep, social  Caffeine use - not take after noon presently  FH - M died age 240, pancreatic CA,  Aunt with hypothyroidism  Noted last visit that she does not watch TV at night before bed, tried melatonin 5 and 10 mg doses, tried OTC gummy bear sleep aid, tried meditation and was not helping She has been concerned with trying meds llike Ambien (had reports of a friend who noted [employer] stuff showing up and ordered overnight and not recall doing so).  She has been to [facility] in the past and she did not feel was helpful  O - NAD, masked  BP 104/68 (BP Location: Right Arm, Patient Position: Sitting, Cuff Size: Large)   Pulse 92   Temp 98 F (36.7 C) (Oral)   Resp 14   Ht 5\' 7"  (1.702 m)   Wt 145 lb (65.8 kg)   SpO2 99%   BMI 22.71 kg/m   HEENT - sclera anicteric, PERRL, EOMI, no proptosis, no sinus tenderness, pharynx clear, TM's and canals clear Neck - no adenopathy nor TM, carotids 2+ and = Skin - no concerns noted on exposed areas and denied concerns otherwise Car - RRR without m/g/r Pulm - CTA Abd - soft, NT, ND, no HSM, no masses Back - no CVA tenderness  Ext - no LE edema, no active joints Neuro - affect not flat, approp with conversation, speech not rapid Grossly non-focal with DTR's 2+ and = patella, good strength and sensation intact to LT in all ext's, Romberg neg, no pronator  drift, good RAM's, good F to [employer] reviewed - all good except mild increase in TSH (5.320) Hearing and vision screen good  Ass - 1. Fatigue - likely due to the insomnia, or at least a contributor. TSH up some c/w low thyroid status and possible contributor, may want to increase dose some and await her f/u with endocrine as she felt best getting their opinion before dose change (copy of labs given to patient to share and she noted she would call them today)             2. Insomnia - more chronic and sleep maintenance (staying asleep) than sleep onset             3. H/o Graves and s/p ablation and on thyroid supp - followed by endo still thru [facility]  4. Annual exam  Plan - Educated  and had discussion on management of chronic insomnia last visit and again today, CBT still first line therapy. She noted counseling in the past has not been helpful for her.             Discussed potential meds, with sleep maintenance meds major SE concern being affecting next day performance, some with dependence concerns, and the two focused on were lunesta and trazodone. She had hear about lunesta and a peer who had some success with this and was one she was comfortable trying.  Lunesta 1mg  qhs rec'ed, and after a couple weeks if working, would try without in hopes of trying to reset the sleep cycles. If not helpful, can try 2 tabs or 2 mg dose and assess. Periodic trials off of the med important if is using noted and will assess response.  Sleep hygiene techniques reviewed prior also important to continue  Await endo input about dose of thyroid supp as above  Cont with f/u with gyn yearly as doing             F/u by end of year at latest, sooner prn noted

## 2018-11-01 NOTE — Patient Instructions (Signed)
Please share the lab results with your endocrinologist as we discussed.

## 2018-12-15 ENCOUNTER — Other Ambulatory Visit: Payer: Self-pay

## 2018-12-15 DIAGNOSIS — G4700 Insomnia, unspecified: Secondary | ICD-10-CM

## 2018-12-16 NOTE — Telephone Encounter (Signed)
Received Prior Authorization form from [facility] for Eszopiclone 1 mg 1 tab po hs prn.  Contacted [employer] at [phone] & spoke with Eddie Dibbles in Therapist, art.    Eszopiclone 1 mg tablets 1 tab po hs prn Qty. 30 Approved for 3 years starting today (12/16/2018 - 12/15/2021).  Pharmacy can refill starting today.  Completed prior authorization form faxed to [facility] (Fax:  [phone]).  AMD

## 2019-01-17 ENCOUNTER — Ambulatory Visit: Payer: BLUE CROSS/BLUE SHIELD | Admitting: Obstetrics and Gynecology

## 2019-02-06 ENCOUNTER — Ambulatory Visit: Payer: BLUE CROSS/BLUE SHIELD | Admitting: Obstetrics and Gynecology

## 2019-03-22 DIAGNOSIS — Z23 Encounter for immunization: Secondary | ICD-10-CM | POA: Diagnosis not present

## 2019-03-27 DIAGNOSIS — L700 Acne vulgaris: Secondary | ICD-10-CM | POA: Diagnosis not present

## 2019-03-27 DIAGNOSIS — L578 Other skin changes due to chronic exposure to nonionizing radiation: Secondary | ICD-10-CM | POA: Diagnosis not present

## 2019-03-27 DIAGNOSIS — D225 Melanocytic nevi of trunk: Secondary | ICD-10-CM | POA: Diagnosis not present

## 2019-03-27 DIAGNOSIS — L814 Other melanin hyperpigmentation: Secondary | ICD-10-CM | POA: Diagnosis not present

## 2019-06-26 DIAGNOSIS — M25511 Pain in right shoulder: Secondary | ICD-10-CM | POA: Diagnosis not present

## 2019-06-26 DIAGNOSIS — M25611 Stiffness of right shoulder, not elsewhere classified: Secondary | ICD-10-CM | POA: Diagnosis not present

## 2019-06-28 DIAGNOSIS — L700 Acne vulgaris: Secondary | ICD-10-CM | POA: Diagnosis not present

## 2019-06-30 DIAGNOSIS — M25611 Stiffness of right shoulder, not elsewhere classified: Secondary | ICD-10-CM | POA: Diagnosis not present

## 2019-06-30 DIAGNOSIS — M25511 Pain in right shoulder: Secondary | ICD-10-CM | POA: Diagnosis not present

## 2019-07-03 DIAGNOSIS — M25511 Pain in right shoulder: Secondary | ICD-10-CM | POA: Diagnosis not present

## 2019-07-03 DIAGNOSIS — M25611 Stiffness of right shoulder, not elsewhere classified: Secondary | ICD-10-CM | POA: Diagnosis not present

## 2019-07-18 DIAGNOSIS — M25511 Pain in right shoulder: Secondary | ICD-10-CM | POA: Diagnosis not present

## 2019-07-18 DIAGNOSIS — M25611 Stiffness of right shoulder, not elsewhere classified: Secondary | ICD-10-CM | POA: Diagnosis not present

## 2019-07-20 DIAGNOSIS — M25611 Stiffness of right shoulder, not elsewhere classified: Secondary | ICD-10-CM | POA: Diagnosis not present

## 2019-07-20 DIAGNOSIS — M25511 Pain in right shoulder: Secondary | ICD-10-CM | POA: Diagnosis not present

## 2019-07-25 DIAGNOSIS — M25511 Pain in right shoulder: Secondary | ICD-10-CM | POA: Diagnosis not present

## 2019-07-25 DIAGNOSIS — M25611 Stiffness of right shoulder, not elsewhere classified: Secondary | ICD-10-CM | POA: Diagnosis not present

## 2019-08-11 DIAGNOSIS — M25511 Pain in right shoulder: Secondary | ICD-10-CM | POA: Diagnosis not present

## 2019-08-11 DIAGNOSIS — M25611 Stiffness of right shoulder, not elsewhere classified: Secondary | ICD-10-CM | POA: Diagnosis not present

## 2019-08-18 DIAGNOSIS — M25511 Pain in right shoulder: Secondary | ICD-10-CM | POA: Diagnosis not present

## 2019-08-18 DIAGNOSIS — M25611 Stiffness of right shoulder, not elsewhere classified: Secondary | ICD-10-CM | POA: Diagnosis not present
